# Patient Record
Sex: Female | Born: 1980 | Race: White | Hispanic: No | Marital: Married | Smoking: Never smoker
Health system: Southern US, Community
[De-identification: ages and names within clinical notes are randomized; demographics above are authoritative.]

## PROBLEM LIST (undated history)

## (undated) DIAGNOSIS — I350 Nonrheumatic aortic (valve) stenosis: Secondary | ICD-10-CM

## (undated) HISTORY — PX: BREAST REDUCTION SURGERY: SHX8

---

## 2010-02-17 ENCOUNTER — Emergency Department: Payer: Self-pay | Admitting: Emergency Medicine

## 2011-08-20 ENCOUNTER — Ambulatory Visit: Payer: Self-pay | Admitting: Medical

## 2011-08-20 LAB — URINALYSIS, COMPLETE
Nitrite: NEGATIVE
Ph: 7.5 (ref 4.5–8.0)
Protein: NEGATIVE

## 2011-08-20 LAB — COMPREHENSIVE METABOLIC PANEL
Anion Gap: 9 (ref 7–16)
BUN: 10 mg/dL (ref 7–18)
Calcium, Total: 8.9 mg/dL (ref 8.5–10.1)
Chloride: 103 mmol/L (ref 98–107)
Co2: 28 mmol/L (ref 21–32)
EGFR (African American): >60
EGFR (Non-African Amer.): >60
Osmolality: 281 (ref 275–301)
Potassium: 3.8 mmol/L (ref 3.5–5.1)
SGOT(AST): 17 U/L (ref 15–37)
SGPT (ALT): 24 U/L
Sodium: 140 mmol/L (ref 136–145)
Total Protein: 7.7 g/dL (ref 6.4–8.2)

## 2011-08-20 LAB — CBC WITH DIFFERENTIAL/PLATELET
Basophil %: 0.2 %
Eosinophil #: 0 10*3/uL (ref 0.0–0.7)
Eosinophil %: 0.1 %
HGB: 13.3 g/dL (ref 12.0–16.0)
Lymphocyte #: 1.3 10*3/uL (ref 1.0–3.6)
MCH: 30.4 pg (ref 26.0–34.0)
Monocyte %: 3.9 %
Neutrophil #: 13 10*3/uL — ABNORMAL HIGH (ref 1.4–6.5)
Neutrophil %: 87.2 %
Platelet: 167 10*3/uL (ref 150–440)
RBC: 4.37 10*6/uL (ref 3.80–5.20)
WBC: 14.9 10*3/uL — ABNORMAL HIGH (ref 3.6–11.0)

## 2011-08-20 LAB — PREGNANCY, URINE: Pregnancy Test, Urine: NEGATIVE m[IU]/mL

## 2011-08-21 LAB — URINE CULTURE

## 2013-09-30 IMAGING — CT CT ABD-PELV W/ CM
1 of 2 series · 15 of 32 positions shown, 19 images · IV contrast (isovue)
Comparison: None

REASON FOR EXAM: [id number]  abd pain  vomiting     hematuria
COMMENTS:

PROCEDURE:     AYE - AYE ABDOMEN / PELVIS W  - August 20, 2011  [DATE]
RESULT:     History: Abdominal pain
TECHNIQUE: Multiple axial images of the abdomen and pelvis were performed
from the lung bases to the pubic symphysis, with p.o. contrast and with 85
ml of Isovue 370 intravenous contrast.

[Series 2: soft tissue · axial · 0.67mm/px · z∈[-462,-48]mm · 15 of 152 slices shown, 19 images]
[im 7/152  soft-tissue]
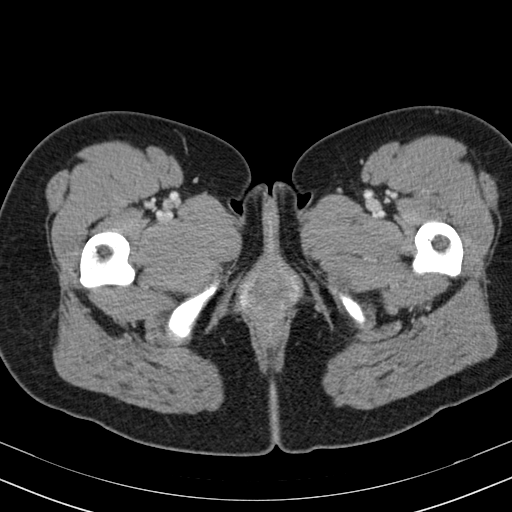
[im 7/152  bone]
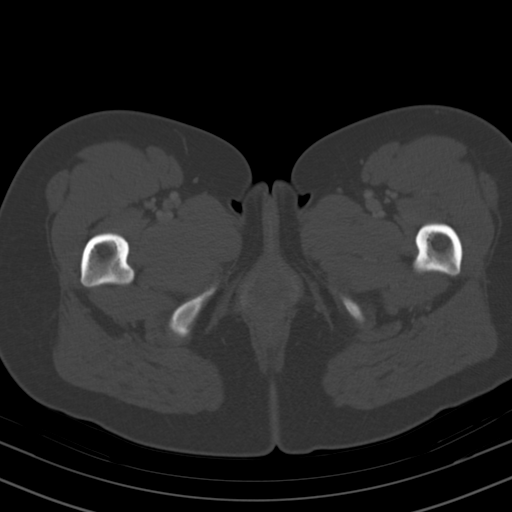
[im 19/152  soft-tissue]
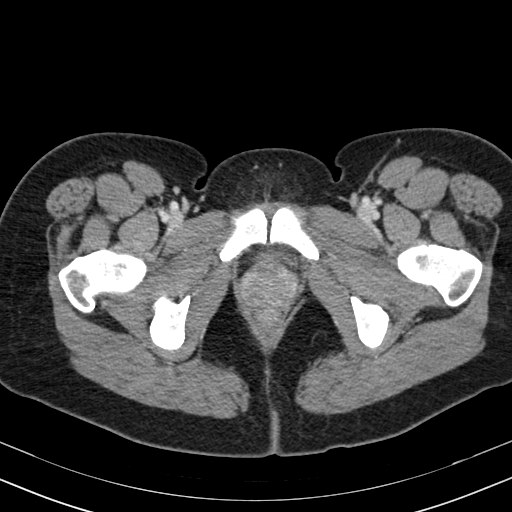
[im 31/152  soft-tissue]
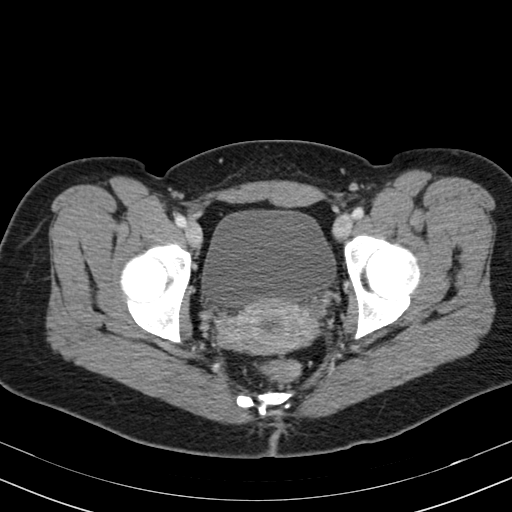
[im 43/152  soft-tissue]
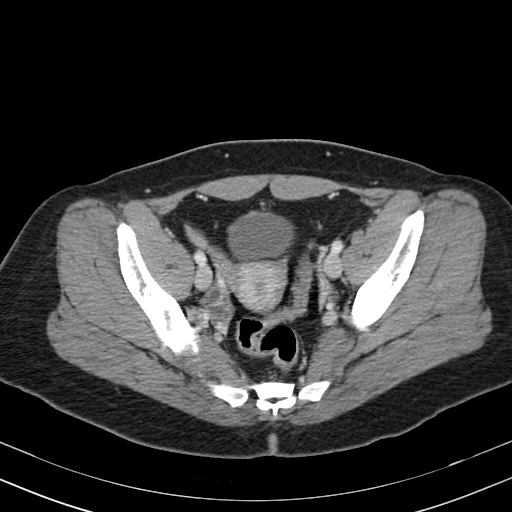
[im 55/152  soft-tissue]
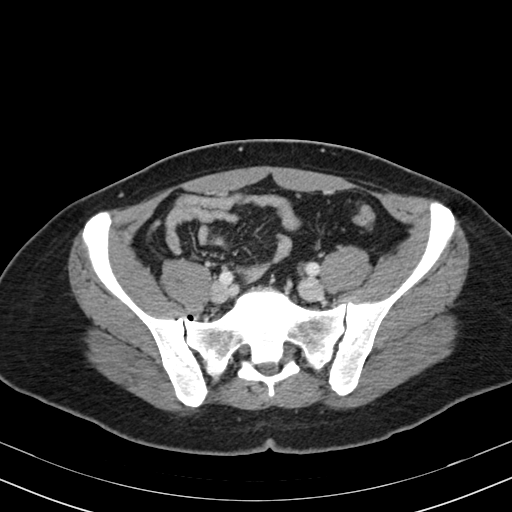
[im 67/152  soft-tissue]
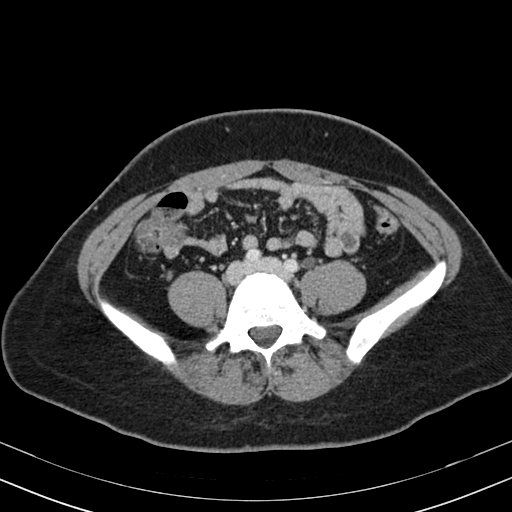
[im 79/152  soft-tissue]
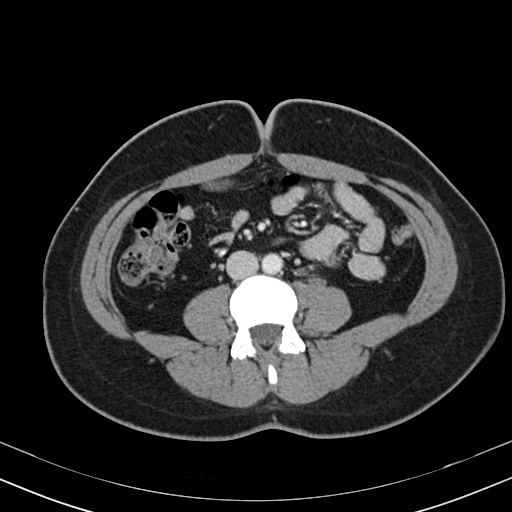
[im 85/152  soft-tissue]
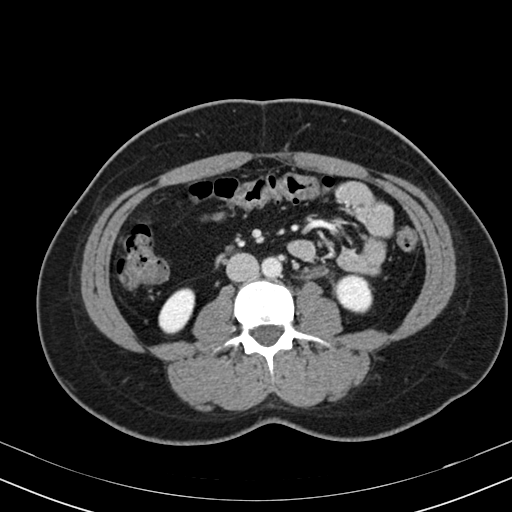
[im 97/152  soft-tissue]
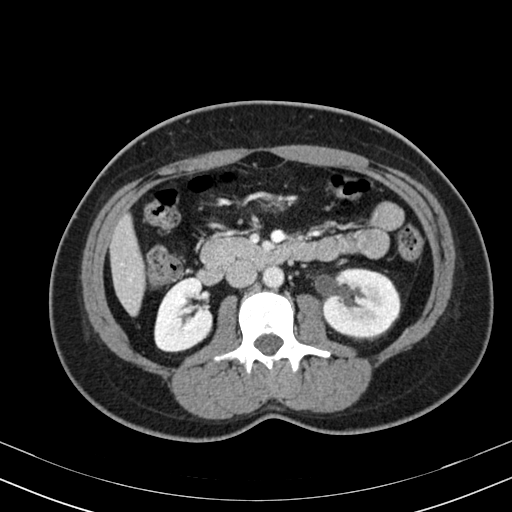
[im 97/152  bone]
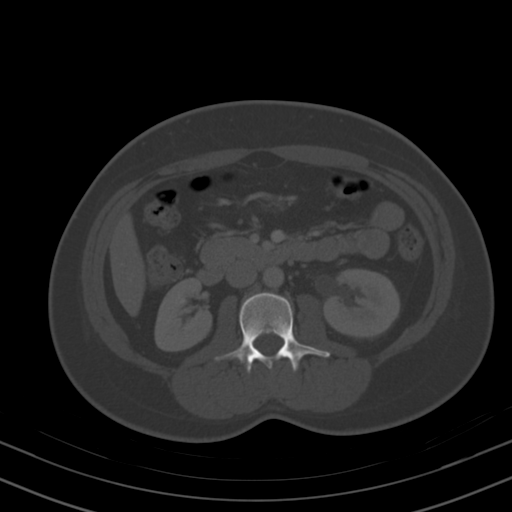
[im 109/152  soft-tissue]
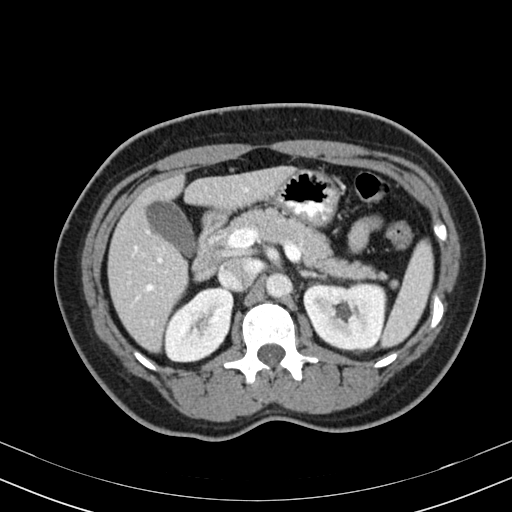
[im 121/152  soft-tissue]
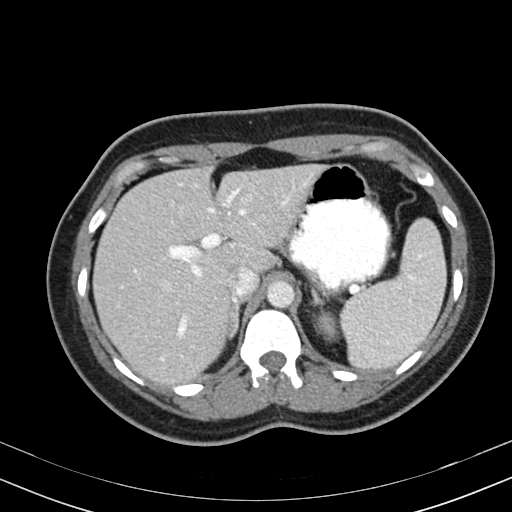
[im 127/152  lung]
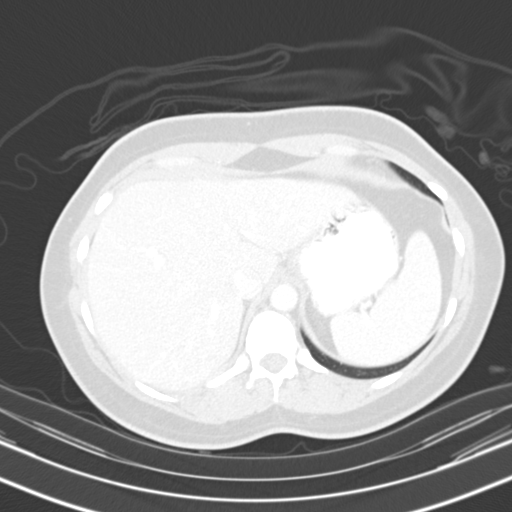
[im 133/152  soft-tissue]
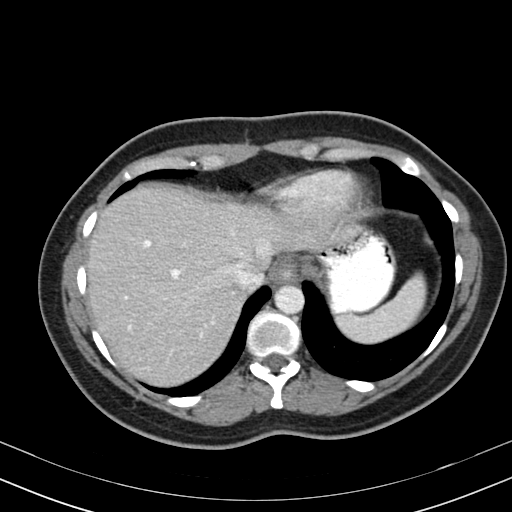
[im 133/152  lung]
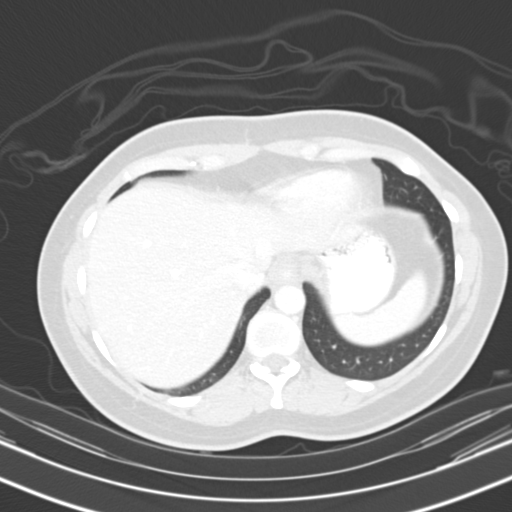
[im 139/152  lung]
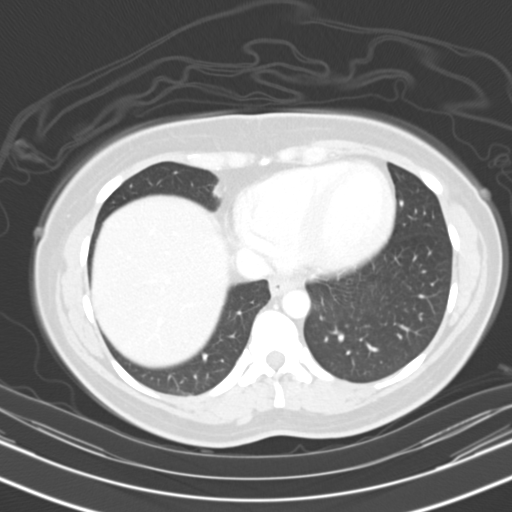
[im 145/152  soft-tissue]
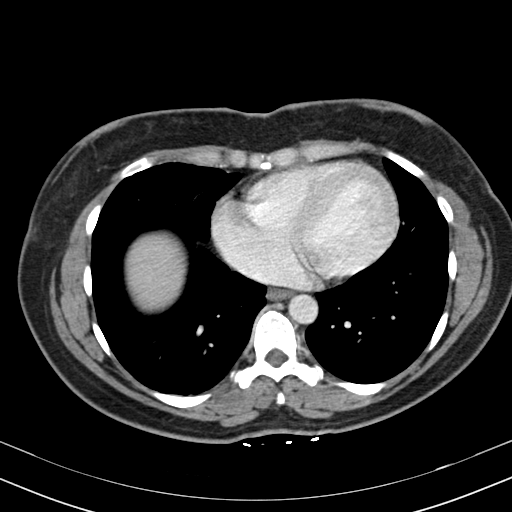
[im 145/152  lung]
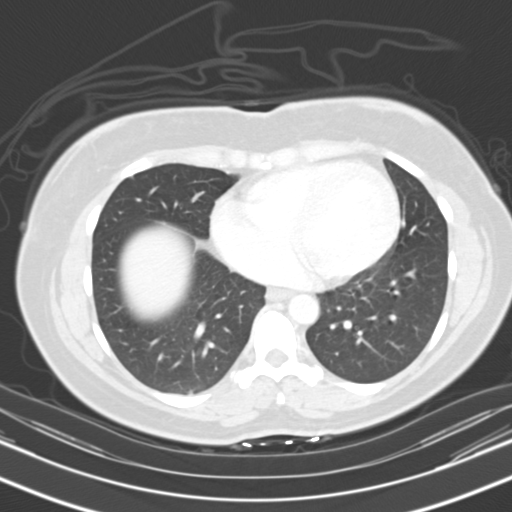

[15 of 32 positions shown; findings below may reference images not displayed]

FINDINGS: The lung bases are clear. There is no pneumothorax. The heart size is
normal.

The liver demonstrates no focal abnormality. There is no intrahepatic or
extrahepatic biliary ductal dilatation. The gallbladder is unremarkable. The
spleen demonstrates no focal abnormality. The adrenal glands, and pancreas
are normal. The bladder is unremarkable.

There is a 6 mm left ureterovesicular junction calculus resulting in mild
left hydroureteronephrosis. There is no other renal or ureteral calculus.

The stomach, duodenum, small intestine, and large intestine demonstrate no
contrast extravasation or dilatation. There is a normal caliber appendix in
the right lower quadrant without periappendiceal inflammatory changes. There
is no pneumoperitoneum, pneumatosis, or portal venous gas. There is no
abdominal or pelvic free fluid. There is no lymphadenopathy.

The abdominal aorta is normal in caliber .

The osseous structures are unremarkable.
IMPRESSION: 1. There is a 6 mm left ureterovesicular junction calculus resulting in mild
left hydroureteronephrosis.

[REDACTED]

## 2014-04-12 ENCOUNTER — Ambulatory Visit: Payer: Self-pay | Admitting: Emergency Medicine

## 2014-04-12 LAB — RAPID INFLUENZA A&B ANTIGENS

## 2015-12-21 ENCOUNTER — Encounter: Payer: Self-pay | Admitting: *Deleted

## 2015-12-21 ENCOUNTER — Ambulatory Visit
Admission: EM | Admit: 2015-12-21 | Discharge: 2015-12-21 | Disposition: A | Discharge status: Home / Self Care | Payer: BLUE CROSS/BLUE SHIELD | Attending: Family Medicine | Admitting: Family Medicine

## 2015-12-21 DIAGNOSIS — J40 Bronchitis, not specified as acute or chronic: Secondary | ICD-10-CM | POA: Diagnosis not present

## 2015-12-21 DIAGNOSIS — J019 Acute sinusitis, unspecified: Secondary | ICD-10-CM

## 2015-12-21 HISTORY — DX: Nonrheumatic aortic (valve) stenosis: I35.0

## 2015-12-21 MED ORDER — BENZONATATE 100 MG PO CAPS: 100.0000 mg | ORAL_CAPSULE | Freq: Three times a day (TID) | ORAL | 0 refills | Status: AC | PRN

## 2015-12-21 MED ORDER — AZITHROMYCIN 250 MG PO TABS: ORAL_TABLET | ORAL | 0 refills | Status: AC

## 2015-12-21 NOTE — Discharge Instructions (Signed)
Take medication as prescribed. Rest. Drink plenty of fluids.  ° °Follow up with your primary care physician this week as needed. Return to Urgent care for new or worsening concerns.  ° °

## 2015-12-21 NOTE — ED Provider Notes (Signed)
[facility] ____________________________________________  Time seen: Approximately 12:08 PM  I have reviewed the triage vital signs and the nursing notes.   HISTORY  Chief Complaint Cough; Nasal Congestion; Headache; and Sore Throat   HPI Bridget Glenn is a 35 y.o. female presents limits of 2 weeks of runny nose, sore throat, nasal congestion, cough. Patient reports sore throat has since resolved. Denies fevers. Reports continues to eat and drink well. Patient reports cough seems to be mostly a dry hacking cough, occasionally productive. Reports others at home recently sick with similar. Reports symptoms unresolved with over the counter cough and congestion medications.   Denies chest pain, shortness of breath, abdominal pain, dysuria, neck pain, back pain, extremity pain or extremity swelling. Denies recent sickness or recent antibiotic use.  ALDRIDGE,BARBARA, MD PCP  Patient's last menstrual period was 12/20/2015 (exact date).: Denies pregnancy.   Past Medical History:  Diagnosis Date  . Aortic stenosis     There are no active problems to display for this patient.   Past Surgical History:  Procedure Laterality Date  . BREAST REDUCTION SURGERY    . CESAREAN SECTION        No current facility-administered medications for this encounter.   Current Outpatient Prescriptions:  .  azithromycin (ZITHROMAX Z-PAK) 250 MG tablet, Take 2 tablets (500 mg) on  Day 1,  followed by 1 tablet (250 mg) once daily on Days 2 through 5., Disp: 6 each, Rfl: 0 .  benzonatate (TESSALON PERLES) 100 MG capsule, Take 1 capsule (100 mg total) by mouth 3 (three) times daily as needed., Disp: 15 capsule, Rfl: 0  Allergies Patient has no known allergies.  History reviewed. No pertinent family history.  Social History Social History  Substance Use Topics  . Smoking status: Never Smoker  . Smokeless tobacco: Never Used  . Alcohol use No    Review of  Systems Constitutional: No fever/chills Eyes: No visual changes. ENT: As above. Cardiovascular: Denies chest pain. Respiratory: Denies shortness of breath. Gastrointestinal: No abdominal pain.  No nausea, no vomiting.  No diarrhea.  No constipation. Genitourinary: Negative for dysuria. Musculoskeletal: Negative for back pain. Skin: Negative for rash. Neurological: Negative for headaches, focal weakness or numbness.  10-point ROS otherwise negative.  ____________________________________________   PHYSICAL EXAM:  VITAL SIGNS: ED Triage Vitals  Enc Vitals Group     BP 12/21/15 1128 128/78     Pulse Rate 12/21/15 1128 74     Resp 12/21/15 1128 16     Temp 12/21/15 1128 97.7 F (36.5 C)     Temp Source 12/21/15 1128 Oral     SpO2 12/21/15 1128 99 %     Weight 12/21/15 1130 170 lb (77.1 kg)     Height 12/21/15 1130 5\' 1"  (1.549 m)     Head Circumference --      Peak Flow --      Pain Score --      Pain Loc --      Pain Edu? --      Excl. in GC? --    Constitutional: Alert and oriented. Well appearing and in no acute distress. Eyes: Conjunctivae are normal. PERRL. EOMI. Head: Atraumatic. Mild maxillary sinus tenderness to palpation, no frontal sinus tenderness to palpation. No swelling. No erythema.  Ears: no erythema, normal TMs bilaterally.   Nose:Nasal congestion with clear rhinorrhea  Mouth/Throat: Mucous membranes are moist. Mild pharyngeal erythema. No tonsillar swelling or exudate.  Neck: No stridor.  No cervical spine tenderness  to palpation. Hematological/Lymphatic/Immunilogical: No cervical lymphadenopathy. Cardiovascular: Normal rate, regular rhythm. Grossly normal heart sounds.  Good peripheral circulation. Respiratory: Normal respiratory effort.  No retractions. Lungs CTAB.No wheezes, rales or rhonchi. Good air movement. Dry intermittent cough noted in room.  Gastrointestinal: Soft and nontender.  Musculoskeletal: No lower or upper extremity tenderness nor  edema. No cervical, thoracic or lumbar tenderness to palpation. Neurologic:  Normal speech and language. No gross focal neurologic deficits are appreciated. No gait instability. Skin:  Skin is warm, dry and intact. No rash noted. Psychiatric: Mood and affect are normal. Speech and behavior are normal.  ___________________________________________   LABS (all labs ordered are listed, but only abnormal results are displayed)  Labs Reviewed - No data to display ____________________________________________   PROCEDURES Procedures   INITIAL IMPRESSION / ASSESSMENT AND PLAN / ED COURSE  Pertinent labs & imaging results that were available during my care of the patient were reviewed by me and considered in my medical decision making (see chart for details).  Well-appearing patient. No acute distress. Suspect bronchitis and sinusitis. Will treat patient with oral azithromycin and when necessary Tessalon Perles. Encouraged supportive care, rest, fluids and PCP follow-up as needed. Discussed indication, risks and benefits of medications with patient.  Discussed follow up with Primary care physician this week. Discussed follow up and return parameters including no resolution or any worsening concerns. Patient verbalized understanding and agreed to plan.   ____________________________________________   FINAL CLINICAL IMPRESSION(S) / ED DIAGNOSES  Final diagnoses:  Bronchitis  Acute sinusitis, recurrence not specified, unspecified location     Discharge Medication List as of 12/21/2015 12:28 PM    START taking these medications   Details  azithromycin (ZITHROMAX Z-PAK) 250 MG tablet Take 2 tablets (500 mg) on  Day 1,  followed by 1 tablet (250 mg) once daily on Days 2 through 5., Normal    benzonatate (TESSALON PERLES) 100 MG capsule Take 1 capsule (100 mg total) by mouth 3 (three) times daily as needed., Starting Sat 12/21/2015, Normal        Note: This dictation was prepared with  Dragon dictation along with smaller phrase technology. Any transcriptional errors that result from this process are unintentional.    Clinical Course       Renford DillsLindsey Wise Fees, NP 12/21/15 1239

## 2015-12-21 NOTE — ED Triage Notes (Signed)
Onset of a sore throat 2 weeks ago has improved since onset but over the past 2 weeks pt has developed a non-productive cough, runny nose- clear, head congestion, and headaches. Denies fever.

## 2015-12-24 ENCOUNTER — Telehealth: Payer: Self-pay

## 2015-12-24 NOTE — Telephone Encounter (Signed)
Courtesy call back completed today for patients recent visit at [facility]. Patient did not answer, left message on voicemail to call back with any questions or concerns.
